# Patient Record
Sex: Male | Born: 1995 | Race: White | Hispanic: No | Marital: Single
Health system: Southern US, Community
[De-identification: ages and names within clinical notes are randomized; demographics above are authoritative.]

---

## 2016-10-10 ENCOUNTER — Emergency Department
Admission: EM | Admit: 2016-10-10 | Discharge: 2016-10-10 | Disposition: A | Discharge status: Home / Self Care | Payer: PRIVATE HEALTH INSURANCE | Attending: Student in an Organized Health Care Education/Training Program | Admitting: Student in an Organized Health Care Education/Training Program

## 2016-10-10 ENCOUNTER — Emergency Department: Payer: PRIVATE HEALTH INSURANCE

## 2016-10-10 ENCOUNTER — Encounter: Payer: Self-pay | Admitting: Radiology

## 2016-10-10 DIAGNOSIS — R112 Nausea with vomiting, unspecified: Secondary | ICD-10-CM | POA: Diagnosis not present

## 2016-10-10 DIAGNOSIS — R509 Fever, unspecified: Secondary | ICD-10-CM

## 2016-10-10 DIAGNOSIS — R52 Pain, unspecified: Secondary | ICD-10-CM | POA: Diagnosis not present

## 2016-10-10 LAB — CBC WITH DIFFERENTIAL/PLATELET
BASOS ABS: 0 10*3/uL (ref 0–0.1)
BASOS PCT: 0 %
EOS PCT: 0 %
Eosinophils Absolute: 0 10*3/uL (ref 0–0.7)
HEMATOCRIT: 45.8 % (ref 40.0–52.0)
Hemoglobin: 15.8 g/dL (ref 13.0–18.0)
Lymphocytes Relative: 3 %
Lymphs Abs: 0.5 10*3/uL — ABNORMAL LOW (ref 1.0–3.6)
MCH: 29.2 pg (ref 26.0–34.0)
MCHC: 34.5 g/dL (ref 32.0–36.0)
MCV: 84.8 fL (ref 80.0–100.0)
MONO ABS: 1 10*3/uL (ref 0.2–1.0)
MONOS PCT: 5 %
NEUTROS ABS: 20 10*3/uL — AB (ref 1.4–6.5)
Neutrophils Relative %: 92 %
PLATELETS: 212 10*3/uL (ref 150–440)
RBC: 5.4 MIL/uL (ref 4.40–5.90)
RDW: 13.7 % (ref 11.5–14.5)
WBC: 21.7 10*3/uL — ABNORMAL HIGH (ref 3.8–10.6)

## 2016-10-10 LAB — COMPREHENSIVE METABOLIC PANEL
ALBUMIN: 4.9 g/dL (ref 3.5–5.0)
ALT: 20 U/L (ref 17–63)
ANION GAP: 11 (ref 5–15)
AST: 32 U/L (ref 15–41)
Alkaline Phosphatase: 61 U/L (ref 38–126)
BILIRUBIN TOTAL: 1 mg/dL (ref 0.3–1.2)
BUN: 19 mg/dL (ref 6–20)
CHLORIDE: 105 mmol/L (ref 101–111)
CO2: 23 mmol/L (ref 22–32)
Calcium: 10.1 mg/dL (ref 8.9–10.3)
Creatinine, Ser: 0.88 mg/dL (ref 0.61–1.24)
GFR calc Af Amer: >60 mL/min (ref >60)
Glucose, Bld: 128 mg/dL — ABNORMAL HIGH (ref 65–99)
POTASSIUM: 3.7 mmol/L (ref 3.5–5.1)
Sodium: 139 mmol/L (ref 135–145)
TOTAL PROTEIN: 8 g/dL (ref 6.5–8.1)

## 2016-10-10 LAB — LIPASE, BLOOD: LIPASE: 22 U/L (ref 11–51)

## 2016-10-10 LAB — INFLUENZA PANEL BY PCR (TYPE A & B)
INFLAPCR: NEGATIVE
INFLBPCR: NEGATIVE

## 2016-10-10 MED ORDER — IOPAMIDOL (ISOVUE-300) INJECTION 61%
100.0000 mL | Freq: Once | INTRAVENOUS | Status: AC | PRN
  Administered 2016-10-10: 100 mL via INTRAVENOUS

## 2016-10-10 MED ORDER — PROMETHAZINE HCL 25 MG/ML IJ SOLN
12.5000 mg | Freq: Once | INTRAMUSCULAR | Status: AC
  Administered 2016-10-10: 12.5 mg via INTRAVENOUS
  Filled 2016-10-10: qty 1

## 2016-10-10 MED ORDER — SODIUM CHLORIDE 0.9 % IV BOLUS (SEPSIS)
1000.0000 mL | Freq: Once | INTRAVENOUS | Status: AC
  Administered 2016-10-10: 1000 mL via INTRAVENOUS

## 2016-10-10 MED ORDER — SODIUM CHLORIDE 0.9 % IV BOLUS (SEPSIS)
1000.0000 mL | Freq: Once | INTRAVENOUS | Status: AC
Start: 2016-10-10 — End: 2016-10-10
  Administered 2016-10-10: 1000 mL via INTRAVENOUS

## 2016-10-10 MED ORDER — PROMETHAZINE HCL 12.5 MG PO TABS: 12.5000 mg | ORAL_TABLET | Freq: Four times a day (QID) | ORAL | 0 refills | Status: AC | PRN

## 2016-10-10 MED ORDER — IBUPROFEN 600 MG PO TABS
600.0000 mg | ORAL_TABLET | Freq: Once | ORAL | Status: AC
  Administered 2016-10-10: 600 mg via ORAL
  Filled 2016-10-10: qty 1

## 2016-10-10 MED ORDER — ACETAMINOPHEN 325 MG PO TABS
650.0000 mg | ORAL_TABLET | Freq: Once | ORAL | Status: AC
  Administered 2016-10-10: 650 mg via ORAL
  Filled 2016-10-10: qty 2

## 2016-10-10 NOTE — ED Provider Notes (Signed)
[facility] Emergency Department Provider Note    First MD Initiated Contact with Patient 10/10/16 1438     (approximate)  I have reviewed the triage vital signs and the nursing notes.   HISTORY  Chief Complaint Emesis    HPI Brandon Krause is a 22 y.o. male presents with diffuse body aches, nausea, watery vomiting and dry eyes that started this morning. States there've been multiple residents in his dorm sick with the same symptoms. He denies any cough or shortness of breath. Do not get his flu shot this year. Did not go out and drink last night. Has been unable to keep any fluids down. Denies any focal pain. His primary complaint is muscle aches.  Denies any history of diabetes or chronic medical conditions.  History reviewed. No pertinent past medical history. FMH: no bleeding disorders PSH: no recent surgeries There are no active problems to display for this patient.     Prior to Admission medications   Medication Sig Start Date End Date Taking? Authorizing Provider  promethazine (PHENERGAN) 12.5 MG tablet Take 1 tablet (12.5 mg total) by mouth every 6 (six) hours as needed for nausea or vomiting. 10/10/16   Willy Eddy, MD    Allergies Patient has no known allergies.    Social History Social History  Substance Use Topics  . Smoking status: Not on file  . Smokeless tobacco: Not on file  . Alcohol use Not on file    Review of Systems Patient denies headaches, rhinorrhea, blurry vision, numbness, shortness of breath, chest pain, edema, cough, abdominal pain, nausea, vomiting, diarrhea, dysuria, fevers, rashes or hallucinations unless otherwise stated above in HPI. ____________________________________________   PHYSICAL EXAM:  VITAL SIGNS: Vitals:   10/10/16 1830 10/10/16 1900  BP: 125/62 122/60  Pulse: (!) 114 (!) 116  Resp:    Temp:      Constitutional: Alert and oriented. Well appearing and in no acute distress. Eyes:  Conjunctivae are normal. PERRL. EOMI. Head: Atraumatic. Nose: No congestion/rhinnorhea. Mouth/Throat: Mucous membranes are dry.  Oropharynx non-erythematous. Neck: No stridor. Painless ROM. No cervical spine tenderness to palpation Hematological/Lymphatic/Immunilogical: No cervical lymphadenopathy. Cardiovascular: Normal rate, regular rhythm. Grossly normal heart sounds.  Good peripheral circulation. Respiratory: Normal respiratory effort.  No retractions. Lungs CTAB. Gastrointestinal: Soft and nontender in all four quadrants. No distention. No abdominal bruits. No CVA tenderness. Musculoskeletal: No lower extremity tenderness nor edema.  No joint effusions. Neurologic:  Normal speech and language. No gross focal neurologic deficits are appreciated. No gait instability. Skin:  Skin is warm, dry and intact. No rash noted. Psychiatric: Mood and affect are normal. Speech and behavior are normal.  ____________________________________________   LABS (all labs ordered are listed, but only abnormal results are displayed)  Results for orders placed or performed during the hospital encounter of 10/10/16 (from the past 24 hour(s))  CBC with Differential     Status: Abnormal   Collection Time: 10/10/16 10:59 AM  Result Value Ref Range   WBC 21.7 (H) 3.8 - 10.6 K/uL   RBC 5.40 4.40 - 5.90 MIL/uL   Hemoglobin 15.8 13.0 - 18.0 g/dL   HCT 16.1 09.6 - 04.5 %   MCV 84.8 80.0 - 100.0 fL   MCH 29.2 26.0 - 34.0 pg   MCHC 34.5 32.0 - 36.0 g/dL   RDW 40.9 81.1 - 91.4 %   Platelets 212 150 - 440 K/uL   Neutrophils Relative % 92 %   Neutro Abs 20.0 (H) 1.4 -  6.5 K/uL   Lymphocytes Relative 3 %   Lymphs Abs 0.5 (L) 1.0 - 3.6 K/uL   Monocytes Relative 5 %   Monocytes Absolute 1.0 0.2 - 1.0 K/uL   Eosinophils Relative 0 %   Eosinophils Absolute 0.0 0 - 0.7 K/uL   Basophils Relative 0 %   Basophils Absolute 0.0 0 - 0.1 K/uL  Comprehensive metabolic panel     Status: Abnormal   Collection Time:  10/10/16 10:59 AM  Result Value Ref Range   Sodium 139 135 - 145 mmol/L   Potassium 3.7 3.5 - 5.1 mmol/L   Chloride 105 101 - 111 mmol/L   CO2 23 22 - 32 mmol/L   Glucose, Bld 128 (H) 65 - 99 mg/dL   BUN 19 6 - 20 mg/dL   Creatinine, Ser 1.47 0.61 - 1.24 mg/dL   Calcium 82.9 8.9 - 56.2 mg/dL   Total Protein 8.0 6.5 - 8.1 g/dL   Albumin 4.9 3.5 - 5.0 g/dL   AST 32 15 - 41 U/L   ALT 20 17 - 63 U/L   Alkaline Phosphatase 61 38 - 126 U/L   Total Bilirubin 1.0 0.3 - 1.2 mg/dL   GFR calc non Af Amer >60 >60 mL/min   GFR calc Af Amer >60 >60 mL/min   Anion gap 11 5 - 15  Lipase, blood     Status: None   Collection Time: 10/10/16  2:46 PM  Result Value Ref Range   Lipase 22 11 - 51 U/L  Influenza panel by PCR (type A & B)     Status: None   Collection Time: 10/10/16  3:26 PM  Result Value Ref Range   Influenza A By PCR NEGATIVE NEGATIVE   Influenza B By PCR NEGATIVE NEGATIVE   ____________________________________________  EKG____________________________________________  RADIOLOGY  I personally reviewed all radiographic images ordered to evaluate for the above acute complaints and reviewed radiology reports and findings.  These findings were personally discussed with the patient.  Please see medical record for radiology report.  ____________________________________________   PROCEDURES  Procedure(s) performed:  Procedures    Critical Care performed: no ____________________________________________   INITIAL IMPRESSION / ASSESSMENT AND PLAN / ED COURSE  Pertinent labs & imaging results that were available during my care of the patient were reviewed by me and considered in my medical decision making (see chart for details).  DDX: flu, dehydration, viral illness, appy, sbo, gastritis, pna  Brandon Krause is a 21 y.o. who presents to the ED with acute flulike illness that started this morning. Patient afebrile hemodynamically stable but does appear dehydrated on exam and  has not been tolerating oral hydration. His abdominal exam is soft and benign. Do not feel CT imaging clinically indicated at this time. Does have a markedly leukocytosis supporting acute infectious process. We'll provide IV fluids as well as antiemetics. We'll check for flow.  The patient will be placed on continuous pulse oximetry and telemetry for monitoring.  Laboratory evaluation will be sent to evaluate for the above complaints.     Clinical Course as of Oct 10 1944  Sat Oct 10, 2016  1627 CXR without consolidation.  Patient rechecked now with fever.    [PR]  1627 Will give motrin   [PR]  1653 Flu negative.  [PR]  1740 Patient reassessed. Does appear to be getting worse. Will give additional IV fluids. On reexamination patient states that he has had some mild right lower quadrant tender to palpation that he  had not thought about earlier. Based on these findings will order CT scan to evaluate for acute appendicitis.  [PR]  1909 CT without evidence of appy.  [PR]  1937 Patient reassessed and feels much improved.  Requesting DC home.  Patient was able to tolerate PO and was able to ambulate with a steady gait.  Likely viral enteritis.  No risk factors for c-diff.  Discussed signs and symptoms for which patient should return immediately to the ER.  Patient with good understanding of return precautions.  Have discussed with the patient and available family all diagnostics and treatments performed thus far and all questions were answered to the best of my ability. The patient demonstrates understanding and agreement with plan.   [PR]    Clinical Course User Index [PR] Willy EddyPatrick Pearce Littlefield, MD     ____________________________________________   FINAL CLINICAL IMPRESSION(S) / ED DIAGNOSES  Final diagnoses:  Non-intractable vomiting with nausea, unspecified vomiting type  Fever, unspecified fever cause      NEW MEDICATIONS STARTED DURING THIS VISIT:  New Prescriptions   PROMETHAZINE  (PHENERGAN) 12.5 MG TABLET    Take 1 tablet (12.5 mg total) by mouth every 6 (six) hours as needed for nausea or vomiting.     Note:  This document was prepared using Dragon voice recognition software and may include unintentional dictation errors.    Willy EddyPatrick Dannie Hattabaugh, MD 10/10/16 1946

## 2016-10-10 NOTE — ED Notes (Signed)
Pt unhooked from monitor at this time to go to the bathroom. NAD Noted at this time. Pt is alert and oriented. Apologized and explained delay to patient, pt states understanding at this time.

## 2016-10-10 NOTE — ED Notes (Signed)
Pt's lights dimmed for comfort at this time. Pt resting in bed watching TV, NAD noted, denies any needs, will continue to monitor for further patient needs.

## 2016-10-10 NOTE — ED Notes (Signed)
Pt taken to CT at this time.

## 2016-10-10 NOTE — ED Notes (Signed)
Pt resting in bed at this time. NAD Noted. Pt remains tachycardic, BP WNL. Respirations even and unabored at this time. Will continue to monitor for further patient needs at this time.

## 2016-10-10 NOTE — Discharge Instructions (Signed)
Stay hydrated.  Start with abland diet for the next two days then ease your way back to normal foods.  Return for worsening symptoms, pain, inability to keep liquids or medications down.

## 2016-10-10 NOTE — ED Notes (Signed)
Report given to Rachel, RN.

## 2018-03-02 IMAGING — DX DG CHEST 1V PORT
1 series · 1 of 1 positions shown · non-contrast
Comparison: None.

CLINICAL DATA: Fever, nausea, vomiting. High WBC Non smoker

EXAM:
PORTABLE CHEST 1 VIEW

[chest ap]
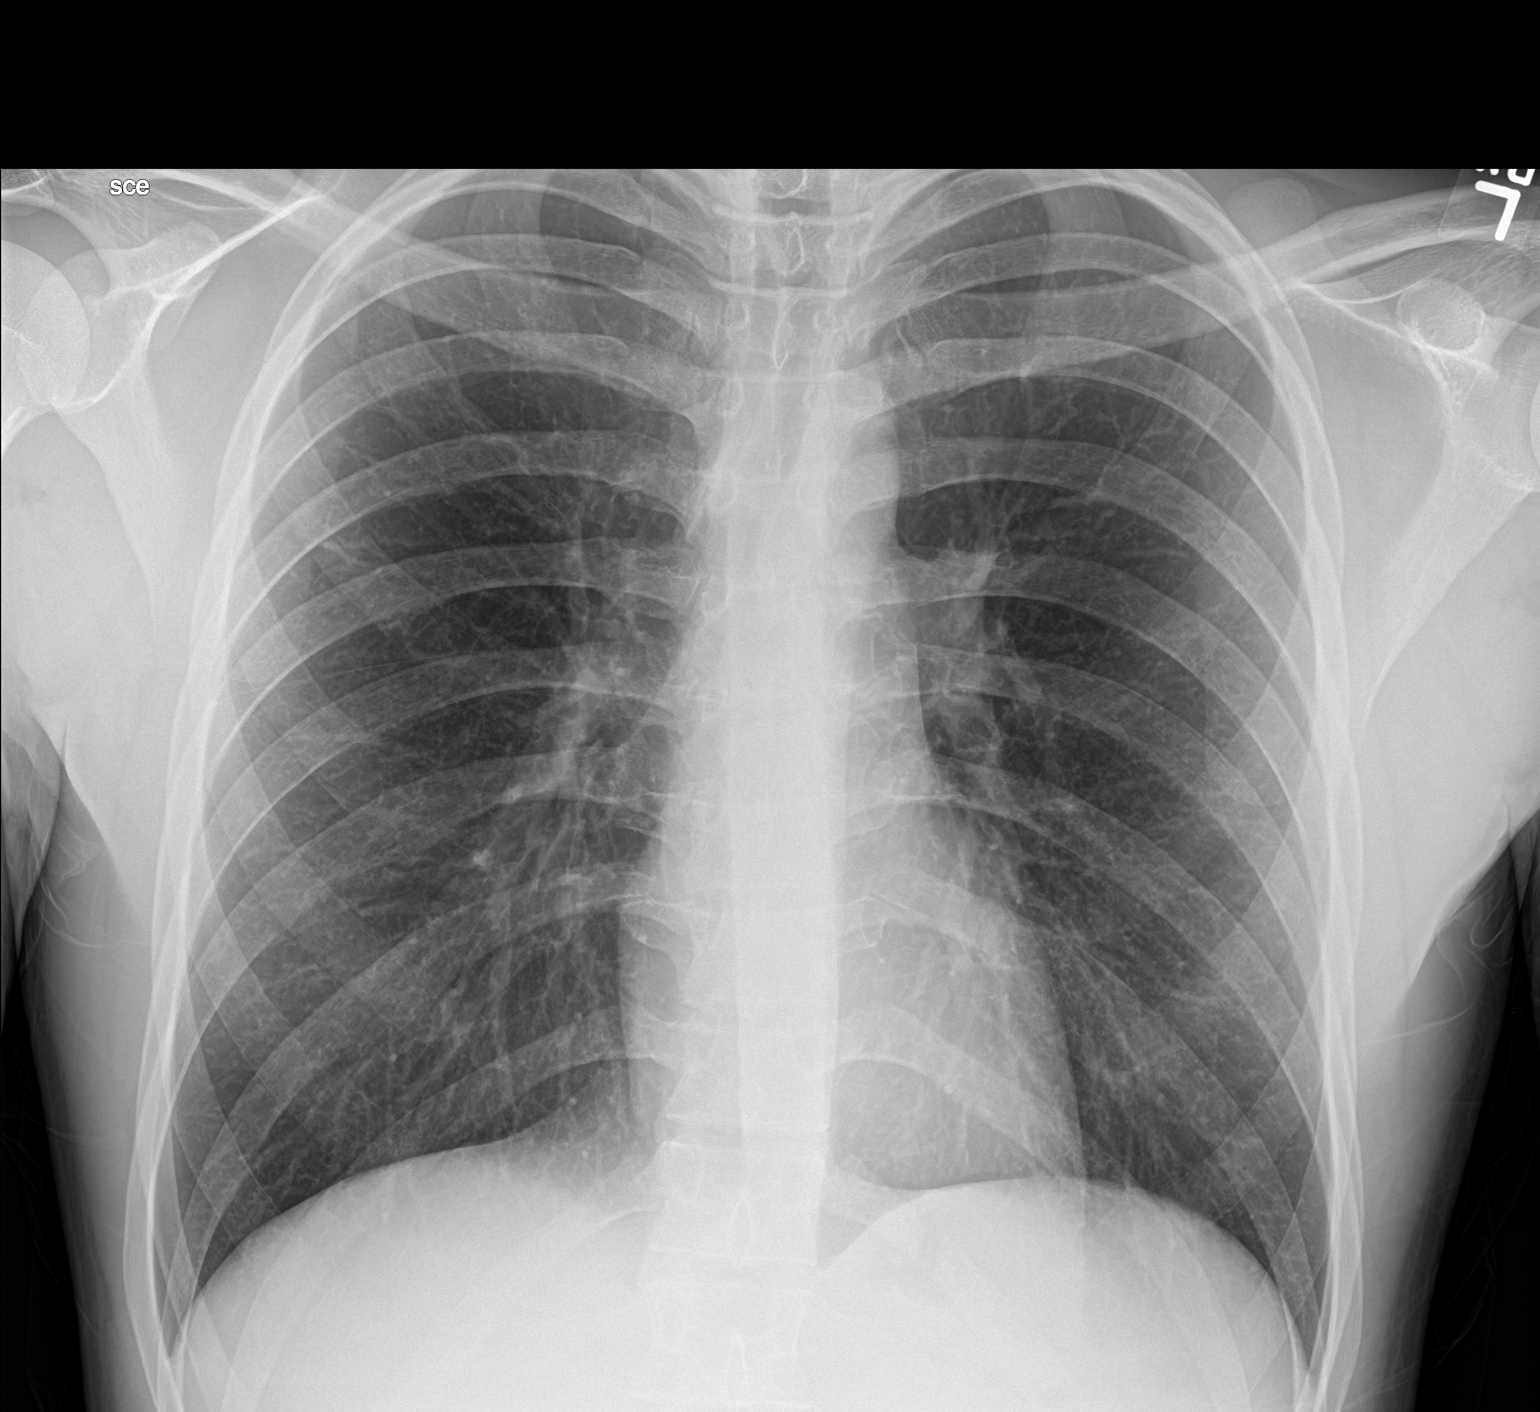

[1 of 1 positions shown; findings below may reference images not displayed]

FINDINGS: Normal mediastinum and cardiac silhouette. Normal pulmonary
vasculature. No evidence of effusion, infiltrate, or pneumothorax.
No acute bony abnormality.
IMPRESSION: No acute cardiopulmonary process.  Normal chest radiograph.

## 2019-07-14 ENCOUNTER — Other Ambulatory Visit: Payer: Self-pay

## 2019-07-14 DIAGNOSIS — Z20822 Contact with and (suspected) exposure to covid-19: Secondary | ICD-10-CM

## 2019-07-16 LAB — NOVEL CORONAVIRUS, NAA: SARS-CoV-2, NAA: NOT DETECTED

## 2019-07-21 ENCOUNTER — Other Ambulatory Visit: Payer: Self-pay | Admitting: *Deleted

## 2019-07-21 DIAGNOSIS — Z20822 Contact with and (suspected) exposure to covid-19: Secondary | ICD-10-CM

## 2019-07-24 LAB — NOVEL CORONAVIRUS, NAA: SARS-CoV-2, NAA: NOT DETECTED

## 2019-12-01 ENCOUNTER — Ambulatory Visit: Payer: PRIVATE HEALTH INSURANCE | Attending: Internal Medicine

## 2019-12-01 DIAGNOSIS — Z23 Encounter for immunization: Secondary | ICD-10-CM

## 2019-12-01 NOTE — Progress Notes (Signed)
   Covid-19 Vaccination Clinic  Name:  Brandon Krause    MRN: [id number] DOB: 08/26/1996  12/01/2019  Mr. Brandon Krause was observed post Covid-19 immunization for 15 minutes without incident. He was provided with Vaccine Information Sheet and instruction to access the V-Safe system.   Mr. Brandon Krause was instructed to call 911 with any severe reactions post vaccine: Marland Kitchen Difficulty breathing  . Swelling of face and throat  . A fast heartbeat  . A bad rash all over body  . Dizziness and weakness   Immunizations Administered    Name Date Dose VIS Date Route   Pfizer COVID-19 Vaccine 12/01/2019 12:21 PM 0.3 mL 09/01/2019 Intramuscular   Manufacturer: [employer], [employer]   Lot: [id number]   NDC: [address]-9

## 2019-12-25 ENCOUNTER — Ambulatory Visit: Payer: PRIVATE HEALTH INSURANCE | Attending: Internal Medicine

## 2019-12-25 DIAGNOSIS — Z23 Encounter for immunization: Secondary | ICD-10-CM

## 2019-12-25 NOTE — Progress Notes (Signed)
   Covid-19 Vaccination Clinic  Name:  Brandon Krause    MRN: [id number] DOB: [birth year]  12/25/2019  [name] was observed post Covid-19 immunization for 15 minutes without incident. He was provided with Vaccine Information Sheet and instruction to access the V-Safe system.   [name] was instructed to call 911 with any severe reactions post vaccine: Marland Kitchen Difficulty breathing  . Swelling of face and throat  . A fast heartbeat  . A bad rash all over body  . Dizziness and weakness   Immunizations Administered    Name Date Dose VIS Date Route   Pfizer COVID-19 Vaccine 12/25/2019  2:51 PM 0.3 mL 09/01/2019 Intramuscular   Manufacturer: [employer], [employer]   Lot: [id number]   NDC: [address]-1
